# Patient Record
Sex: Male | Born: 1990 | Race: White | Hispanic: No | Marital: Single | Smoking: Never smoker
Health system: Southern US, Community
[De-identification: ages and names within clinical notes are randomized; demographics above are authoritative.]

---
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
## 2013-11-03 ENCOUNTER — Emergency Department: Payer: Self-pay | Admitting: Emergency Medicine

## 2016-09-29 ENCOUNTER — Encounter: Payer: Self-pay | Admitting: Urgent Care

## 2016-09-29 DIAGNOSIS — K7010 Alcoholic hepatitis without ascites: Secondary | ICD-10-CM | POA: Insufficient documentation

## 2016-09-29 DIAGNOSIS — R1011 Right upper quadrant pain: Secondary | ICD-10-CM | POA: Diagnosis present

## 2016-09-29 LAB — CBC
HCT: 45.7 % (ref 40.0–52.0)
Hemoglobin: 15.6 g/dL (ref 13.0–18.0)
MCH: 33 pg (ref 26.0–34.0)
MCHC: 34.1 g/dL (ref 32.0–36.0)
MCV: 97 fL (ref 80.0–100.0)
PLATELETS: 117 10*3/uL — AB (ref 150–440)
RBC: 4.72 MIL/uL (ref 4.40–5.90)
RDW: 13.4 % (ref 11.5–14.5)
WBC: 6.3 10*3/uL (ref 3.8–10.6)

## 2016-09-29 NOTE — ED Triage Notes (Signed)
Patient presents with c/o palpitations that began earlier tonight while at work. Patient reports that he became nauseated and experienced a couple of episodes of hematemesis. Patient went onto [employer], which reportedly "freaked him out", prompting him to come in. Patient reports that he used alcohol last night; denies illegal drug use.

## 2016-09-30 ENCOUNTER — Emergency Department: Payer: PRIVATE HEALTH INSURANCE

## 2016-09-30 ENCOUNTER — Emergency Department
Admission: EM | Admit: 2016-09-30 | Discharge: 2016-09-30 | Disposition: A | Discharge status: Home / Self Care | Payer: PRIVATE HEALTH INSURANCE | Attending: Emergency Medicine | Admitting: Emergency Medicine

## 2016-09-30 DIAGNOSIS — K7010 Alcoholic hepatitis without ascites: Secondary | ICD-10-CM

## 2016-09-30 DIAGNOSIS — R1011 Right upper quadrant pain: Secondary | ICD-10-CM

## 2016-09-30 LAB — COMPREHENSIVE METABOLIC PANEL
ALK PHOS: 79 U/L (ref 38–126)
ALT: 144 U/L — AB (ref 17–63)
AST: 171 U/L — AB (ref 15–41)
Albumin: 4.9 g/dL (ref 3.5–5.0)
Anion gap: 15 (ref 5–15)
BILIRUBIN TOTAL: 1.3 mg/dL — AB (ref 0.3–1.2)
BUN: 8 mg/dL (ref 6–20)
CALCIUM: 9.5 mg/dL (ref 8.9–10.3)
CHLORIDE: 96 mmol/L — AB (ref 101–111)
CO2: 28 mmol/L (ref 22–32)
CREATININE: 0.85 mg/dL (ref 0.61–1.24)
Glucose, Bld: 152 mg/dL — ABNORMAL HIGH (ref 65–99)
Potassium: 3.7 mmol/L (ref 3.5–5.1)
Sodium: 139 mmol/L (ref 135–145)
TOTAL PROTEIN: 8.6 g/dL — AB (ref 6.5–8.1)

## 2016-09-30 LAB — TROPONIN I: Troponin I: <0.03 ng/mL (ref <0.03)

## 2016-09-30 LAB — LIPASE, BLOOD: LIPASE: 20 U/L (ref 11–51)

## 2016-09-30 MED ORDER — IBUPROFEN 600 MG PO TABS
600.0000 mg | ORAL_TABLET | Freq: Once | ORAL | Status: AC
  Administered 2016-09-30: 600 mg via ORAL
  Filled 2016-09-30: qty 1

## 2016-09-30 MED ORDER — ONDANSETRON HCL 4 MG/2ML IJ SOLN
4.0000 mg | Freq: Once | INTRAMUSCULAR | Status: AC
  Administered 2016-09-30: 4 mg via INTRAVENOUS
  Filled 2016-09-30: qty 2

## 2016-09-30 MED ORDER — TRAMADOL HCL 50 MG PO TABS: 50.0000 mg | ORAL_TABLET | Freq: Four times a day (QID) | ORAL | 0 refills | Status: AC | PRN

## 2016-09-30 MED ORDER — ONDANSETRON HCL 4 MG/2ML IJ SOLN
INTRAMUSCULAR | Status: AC
  Administered 2016-09-30: 4 mg via INTRAVENOUS
  Filled 2016-09-30: qty 2

## 2016-09-30 MED ORDER — ONDANSETRON 4 MG PO TBDP: 4.0000 mg | ORAL_TABLET | Freq: Three times a day (TID) | ORAL | 0 refills | Status: DC | PRN

## 2016-09-30 MED ORDER — SODIUM CHLORIDE 0.9 % IV BOLUS (SEPSIS)
1000.0000 mL | Freq: Once | INTRAVENOUS | Status: AC
  Administered 2016-09-30: 1000 mL via INTRAVENOUS

## 2016-09-30 MED ORDER — ONDANSETRON HCL 4 MG/2ML IJ SOLN
4.0000 mg | Freq: Once | INTRAMUSCULAR | Status: AC
  Administered 2016-09-30: 4 mg via INTRAVENOUS

## 2016-09-30 NOTE — ED Notes (Signed)
Pt returned from [place]. States feels dehydrated and has HA. Asking for tylenol.

## 2016-09-30 NOTE — ED Notes (Signed)
Pt given multiple water cups to drink. Has not thrown up since starting PO fluids.

## 2016-09-30 NOTE — ED Notes (Signed)
Dr. Brown at bedside

## 2016-09-30 NOTE — ED Provider Notes (Signed)
[facility]    First MD Initiated Contact with Patient 09/30/16 [phone]     (approximate)  I have reviewed the triage vital signs and the nursing notes.   HISTORY  Chief Complaint Palpitations and Hematemesis    HPI Connor Davis is a 25 y.o. male presents with nausea vomiting and epigastric/right upper quadrant abdominal pain 1 day. Patient states that he has been drinking heavily for the past week approximately a fifth per day in addition to beer. Starting today the patient admits to 8 out of 10 epigastric/right upper quadrant abdominal pain as well as headache. Patient denies any fever and no diarrhea or constipation.   Past medical history None There are no active problems to display for this patient.   Past surgical history None  Prior to Admission medications   Medication Sig Start Date End Date Taking? Authorizing Provider  ondansetron (ZOFRAN ODT) 4 MG disintegrating tablet Take 1 tablet (4 mg total) by mouth every 8 (eight) hours as needed for nausea or vomiting. 09/30/16   Connor Current, MD  traMADol (ULTRAM) 50 MG tablet Take 1 tablet (50 mg total) by mouth every 6 (six) hours as needed. 09/30/16 09/30/17  Connor Current, MD    Allergies Shrimp [shellfish allergy]  No family history on file.  Social History Social History  Substance Use Topics  . Smoking status: Never Smoker  . Smokeless tobacco: Never Used  . Alcohol use Yes    Review of Systems Constitutional: No fever/chills Eyes: No visual changes. ENT: No sore throat. Cardiovascular: Denies chest pain. Respiratory: Denies shortness of breath. Gastrointestinal: Positive for abdominal pain.  And vomiting  constipation. Genitourinary: Negative for dysuria. Musculoskeletal: Negative for back pain. Skin: Negative for rash. Neurological: Negative for headaches, focal weakness or numbness.  10-point ROS otherwise  negative.  ____________________________________________   PHYSICAL EXAM:  VITAL SIGNS: ED Triage Vitals  Enc Vitals Group     BP 09/29/16 2329 (!) 167/100     Pulse Rate 09/29/16 2329 (!) 110     Resp 09/29/16 2329 20     Temp 09/29/16 2329 97.7 F (36.5 C)     Temp Source 09/29/16 2329 Oral     SpO2 09/29/16 2329 100 %     Weight 09/29/16 2331 225 lb (102.1 kg)     Height 09/29/16 2331 5\' 11"  (1.803 m)     Head Circumference --      Peak Flow --      Pain Score 09/29/16 2339 2     Pain Loc --      Pain Edu? --      Excl. in GC? --     Constitutional: Alert and oriented. Well appearing and in no acute distress. Eyes: Conjunctivae are normal. PERRL. EOMI. Head: Atraumatic. Mouth/Throat: Mucous membranes are Dry.  Oropharynx non-erythematous. Neck: No stridor.  No meningeal signs.  No cervical spine tenderness to palpation. Cardiovascular: Normal rate, regular rhythm. Good peripheral circulation. Grossly normal heart sounds. Respiratory: Normal respiratory effort.  No retractions. Lungs CTAB. Gastrointestinal: Right upper quadrant/epigastric abdominal pain with palpation. No distention.  Musculoskeletal: No lower extremity tenderness nor edema. No gross deformities of extremities. Neurologic:  Normal speech and language. No gross focal neurologic deficits are appreciated.  Skin:  Skin is warm, dry and intact. No rash noted. Psychiatric: Mood and affect are normal. Speech and behavior are normal.  ____________________________________________   LABS (all labs ordered are listed, but only abnormal results are displayed)  Labs Reviewed  CBC - Abnormal; Notable for the following:       Result Value   Platelets 117 (*)    All other components within normal limits  COMPREHENSIVE METABOLIC PANEL - Abnormal; Notable for the following:    Chloride 96 (*)    Glucose, Bld 152 (*)    Total Protein 8.6 (*)    AST 171 (*)    ALT 144 (*)    Total Bilirubin 1.3 (*)    All other  components within normal limits  TROPONIN I  LIPASE, BLOOD   ____________________________________________  EKG  ED ECG REPORT I, [place] N Hutton Pellicane, the attending physician, personally viewed and interpreted this ECG.   Date: 09/30/2016  EKG Time: 11:37 PM  Rate: 101  Rhythm: Sinus tachycardia  Axis: Normal  Intervals: Normal  ST&T Change: None  ____________________________________________  RADIOLOGY I, [place] N Richetta Cubillos, personally viewed and evaluated these images (plain radiographs) as part of my medical decision making, as well as reviewing the written report by the radiologist.  [place] Abdomen Limited Ruq  Result Date: 09/30/2016 CLINICAL DATA:  25 year old male with right upper quadrant abdominal pain. Nausea and vomiting. EXAM: US ABDOMEN LIMITED - RIGHT UPPER QUADRANT COMPARISON:  None. FINDINGS: Gallbladder: No gallstones or wall thickening visualized. No sonographic Murphy sign noted by sonographer. Common bile duct: Diameter: 3 mm Liver: No focal lesion identified. Within normal limits in parenchymal echogenicity. IMPRESSION: Unremarkable right upper quadrant ultrasound. Electronically Signed   By: Elgie CollardArash  Radparvar M.D.   On: 09/30/2016 04:27      Procedures    INITIAL IMPRESSION / ASSESSMENT AND PLAN / ED COURSE  Pertinent labs & imaging results that were available during my care of the patient were reviewed by me and considered in my medical decision making (see chart for details).  Patient given IV Zofran and 2 L IV normal saline with no further vomiting noting any emergency department. History physical exam and laboratory data concerning for possible alcohol-induced hepatitis. Spoke with patient at length regarding the need to decrease alcohol intake. Patient will be prescribed Zofran for home with recommendation to follow-up with primary care provider for repeat liver enzymes  Clinical Course    ____________________________________________  FINAL CLINICAL  IMPRESSION(S) / ED DIAGNOSES  Final diagnoses:  RUQ pain  Alcoholic hepatitis without ascites     MEDICATIONS GIVEN DURING THIS VISIT:  Medications  ondansetron (ZOFRAN) injection 4 mg (4 mg Intravenous Given 09/30/16 0220)  sodium chloride 0.9 % bolus 1,000 mL (1,000 mLs Intravenous New Bag/Given 09/30/16 0450)  ondansetron (ZOFRAN) injection 4 mg (4 mg Intravenous Given 09/30/16 0450)  ibuprofen (ADVIL,MOTRIN) tablet 600 mg (600 mg Oral Given 09/30/16 0450)     NEW OUTPATIENT MEDICATIONS STARTED DURING THIS VISIT:  New Prescriptions   ONDANSETRON (ZOFRAN ODT) 4 MG DISINTEGRATING TABLET    Take 1 tablet (4 mg total) by mouth every 8 (eight) hours as needed for nausea or vomiting.   TRAMADOL (ULTRAM) 50 MG TABLET    Take 1 tablet (50 mg total) by mouth every 6 (six) hours as needed.    Modified Medications   No medications on file    Discontinued Medications   No medications on file     Note:  This document was prepared using Dragon voice recognition software and may include unintentional dictation errors.    Connor Currentandolph N Kaz Auld, MD 09/30/16 [phone]

## 2016-10-06 ENCOUNTER — Encounter: Payer: Self-pay | Admitting: Internal Medicine

## 2016-10-06 ENCOUNTER — Ambulatory Visit (INDEPENDENT_AMBULATORY_CARE_PROVIDER_SITE_OTHER): Payer: PRIVATE HEALTH INSURANCE | Admitting: Internal Medicine

## 2016-10-06 VITALS — BP 132/86 | HR 73 | Temp 97.7°F | Ht 69.5 in | Wt 200.0 lb

## 2016-10-06 DIAGNOSIS — K7010 Alcoholic hepatitis without ascites: Secondary | ICD-10-CM | POA: Diagnosis not present

## 2016-10-06 DIAGNOSIS — R748 Abnormal levels of other serum enzymes: Secondary | ICD-10-CM

## 2016-10-06 LAB — COMPREHENSIVE METABOLIC PANEL
ALT: 122 U/L — AB (ref 0–53)
AST: 186 U/L — AB (ref 0–37)
Albumin: 4.4 g/dL (ref 3.5–5.2)
Alkaline Phosphatase: 74 U/L (ref 39–117)
BUN: 14 mg/dL (ref 6–23)
CALCIUM: 9.7 mg/dL (ref 8.4–10.5)
CO2: 28 mEq/L (ref 19–32)
Chloride: 104 mEq/L (ref 96–112)
Creatinine, Ser: 0.89 mg/dL (ref 0.40–1.50)
GFR: 110.26 mL/min (ref >60.00)
Glucose, Bld: 91 mg/dL (ref 70–99)
Potassium: 4.4 mEq/L (ref 3.5–5.1)
Sodium: 138 mEq/L (ref 135–145)
TOTAL PROTEIN: 7.6 g/dL (ref 6.0–8.3)
Total Bilirubin: 0.8 mg/dL (ref 0.2–1.2)

## 2016-10-06 NOTE — Patient Instructions (Signed)
Alcoholic Hepatitis °Alcoholic hepatitis is liver inflammation caused by drinking alcohol. This inflammation decreases the liver's ability to function normally.  °CAUSES  °Alcoholic hepatitis is caused by heavy drinking.  °RISK FACTORS °You may have an increased risk of alcoholic hepatitis if:  °· You drink large amounts of alcohol. °· You have been drinking heavily for years. °· You binge drink. °· You are male. °· You are obese. °· You have had infectious hepatitis. °· You are malnourished. °· You have close family members who have had alcoholic hepatitis. °SIGNS AND SYMPTOMS °· Abdominal pain. °· A swollen abdomen. °· Loss of appetite. °· Unintentional weight loss. °· Nausea and vomiting. °· Tiredness. °· Dry mouth. °· Severe thirst. °· A yellow tone to the skin and whites of the eyes (jaundice). °· Spidery veins, especially across the skin of the abdomen. °· Unusual bleeding. °· Itching. °· Trouble thinking clearly. °· Memory problems. °· Mood changes. °· Confusion. °· Numbness and tingling in the feet and legs. °DIAGNOSIS  °Alcoholic hepatitis is diagnosed with blood tests that show problems with liver function. Additional tests may also be done, such as: °· An ultrasound. °· A CT scan. °· An MRI scan. °· A liver biopsy. For this test, a small sample of the liver will be taken and examined for evidence of liver damage. °TREATMENT °The most important step you can take to treat alcoholic hepatitis is to stop drinking alcohol. If you are addicted to alcohol, your health care provider will help you create a plan to quit. It may involve: °· Taking medicine to decrease withdrawal symptoms. °· Entering a program to help you stop drinking. °· Joining a support group. °Additional treatment for alcoholic hepatitis may include: °· Medicines such as steroids. The medicines will help decrease the inflammation. °· Diet. Your health care provider might ask you to undergo nutritional counseling and follow a diet. You may  also need to take dietary supplements and vitamins. °· A liver transplant. This procedure is performed in very severe cases. It is only performed on people who have totally stopped drinking and can commit to never drinking alcohol again. °HOME CARE INSTRUCTIONS °· Do not drink alcohol. °· Do not use medicines or eat foods that contain alcohol. °· Take medicines only as directed by your health care provider. °· Follow dietary instructions carefully. °· Keep all follow-up visits as directed by your health care provider. This is important. °SEEK MEDICAL CARE IF: °· You have a fever. °· You do not have your usual appetite. °· You have flu-like symptoms such as fatigue, weakness, or muscle aches. °· You feel nauseous or vomit. °· You bruise easily. °· Your urine is very dark. °· You have new abdominal pain. °SEEK IMMEDIATE MEDICAL CARE IF: °· There is blood in your vomit. °· You develop jaundice. °· Your skin itches severely. °· Your legs swell. °· Your stomach appears bloated. °· You have black, tarry-appearing stools. °· You bleed easily. °· You are confused or not thinking clearly. °· You have a seizure. °MAKE SURE YOU: °· Understand these instructions. °· Will watch your condition. °· Will get help right away if you are not doing well or get worse. °  °This information is not intended to replace advice given to you by your health care provider. Make sure you discuss any questions you have with your health care provider. °  °Document Released: 06/18/2014 Document Reviewed: 06/18/2014 °Elsevier Interactive Patient Education ©2016 [employer] Inc. ° °

## 2016-10-06 NOTE — Progress Notes (Signed)
HPI  Pt presents to the clinic today to establish care. He has not had a PCP in many years.  He reports he was recently in the ER 09/30/16, after having some RUQ abdominal pain, nausea and vomiting. Liver enzymes were elevated. Ultrasound of the RUQ was normal. He was diagnosed with alcoholic hepatitis and advised to stop drinking and follow up with his PCP. He reports he has not had any alcohol since that time. He does work in [employer]. He denies IV drug use or sexual contact with someone how has known Hep C.  Flu: never Tetanus: 2014, he thinks Dentist: as needed  History reviewed. No pertinent past medical history.  Current Outpatient Prescriptions  Medication Sig Dispense Refill  . ibuprofen (ADVIL,MOTRIN) 200 MG tablet Take 200 mg by mouth 3 times/day as needed-between meals & bedtime.    . ondansetron (ZOFRAN ODT) 4 MG disintegrating tablet Take 1 tablet (4 mg total) by mouth every 8 (eight) hours as needed for nausea or vomiting. 20 tablet 0  . traMADol (ULTRAM) 50 MG tablet Take 1 tablet (50 mg total) by mouth every 6 (six) hours as needed. 20 tablet 0   No current facility-administered medications for this visit.     Allergies  Allergen Reactions  . Amoxicillin Hives  . Penicillins Hives  . Shrimp [Shellfish Allergy]     Family History  Problem Relation Age of Onset  . Hypertension Mother   . Hypertension Father   . Hypertension Brother   . Diabetes Maternal Aunt   . Hypertension Paternal Aunt   . Diabetes Maternal Grandmother     Social History   Social History  . Marital status: Single    Spouse name: N/A  . Number of children: N/A  . Years of education: N/A   Occupational History  . Not on file.   Social History Main Topics  . Smoking status: Never Smoker  . Smokeless tobacco: Never Used  . Alcohol use Yes     Comment: beer/liquor--moderate to daily  . Drug use: No  . Sexual activity: Not on file   Other Topics Concern  . Not on file    Social History Narrative  . No narrative on file    ROS:  Constitutional: Denies fever, malaise, fatigue, headache or abrupt weight changes.  Respiratory: Denies difficulty breathing, shortness of breath, cough or sputum production.   Cardiovascular: Denies chest pain, chest tightness, palpitations or swelling in the hands or feet.  Gastrointestinal: Denies abdominal pain, bloating, constipation, diarrhea or blood in the stool.  Skin: Denies redness, rashes, lesions or ulcercations.  Neurological: Denies dizziness, difficulty with memory, difficulty with speech or problems with balance and coordination.  Psych: Denies anxiety, depression, SI/HI.  No other specific complaints in a complete review of systems (except as listed in HPI above).  PE:  BP 132/86   Pulse 73   Temp 97.7 F (36.5 C) (Oral)   Ht 5' 9.5" (1.765 m)   Wt 200 lb (90.7 kg)   SpO2 98%   BMI 29.11 kg/m  Wt Readings from Last 3 Encounters:  10/06/16 200 lb (90.7 kg)  09/29/16 225 lb (102.1 kg)    General: Appears his stated age, well developed, well nourished in NAD. Cardiovascular: Normal rate and rhythm. S1,S2 noted.  No murmur, rubs or gallops noted. Pulmonary/Chest: Normal effort and positive vesicular breath sounds. No respiratory distress. No wheezes, rales or ronchi noted.  Abdomen: Soft and nontender. Normal bowel sounds. No distention or  masses noted. Liver, spleen and kidneys non palpable. Neurological: Alert and oriented.  Psychiatric: Mood and affect normal. Behavior is normal. Judgment and thought content normal.     BMET    Component Value Date/Time   NA 139 09/29/2016 2343   K 3.7 09/29/2016 2343   CL 96 (L) 09/29/2016 2343   CO2 28 09/29/2016 2343   GLUCOSE 152 (H) 09/29/2016 2343   BUN 8 09/29/2016 2343   CREATININE 0.85 09/29/2016 2343   CALCIUM 9.5 09/29/2016 2343   GFRNONAA >60 09/29/2016 2343   GFRAA >60 09/29/2016 2343    Lipid Panel  No results found for: CHOL, TRIG,  HDL, CHOLHDL, VLDL, LDLCALC  CBC    Component Value Date/Time   WBC 6.3 09/29/2016 2343   RBC 4.72 09/29/2016 2343   HGB 15.6 09/29/2016 2343   HCT 45.7 09/29/2016 2343   PLT 117 (L) 09/29/2016 2343   MCV 97.0 09/29/2016 2343   MCH 33.0 09/29/2016 2343   MCHC 34.1 09/29/2016 2343   RDW 13.4 09/29/2016 2343    Hgb A1C No results found for: HGBA1C   Assessment and Plan:  ER follow up for elevated liver enzymes:  ER notes, labs and imaging reviewed Encouraged him to stay abstinent from ETOH CMET and acute hep panel today  Will follow up after labs, RTC in 1 year for your annual exam Nicki ReaperBAITY, REGINA, NP

## 2016-10-07 LAB — HEPATITIS PANEL, ACUTE
HCV AB: NEGATIVE
HEP B C IGM: NONREACTIVE
Hep A IgM: NONREACTIVE
Hepatitis B Surface Ag: NEGATIVE

## 2016-11-02 ENCOUNTER — Other Ambulatory Visit: Payer: Self-pay | Admitting: Internal Medicine

## 2016-11-02 DIAGNOSIS — R748 Abnormal levels of other serum enzymes: Secondary | ICD-10-CM

## 2016-11-08 ENCOUNTER — Other Ambulatory Visit (INDEPENDENT_AMBULATORY_CARE_PROVIDER_SITE_OTHER): Payer: PRIVATE HEALTH INSURANCE

## 2016-11-08 DIAGNOSIS — R748 Abnormal levels of other serum enzymes: Secondary | ICD-10-CM | POA: Diagnosis not present

## 2016-11-08 LAB — COMPREHENSIVE METABOLIC PANEL
ALT: 19 U/L (ref 0–53)
AST: 23 U/L (ref 0–37)
Albumin: 4.2 g/dL (ref 3.5–5.2)
Alkaline Phosphatase: 77 U/L (ref 39–117)
BILIRUBIN TOTAL: 0.5 mg/dL (ref 0.2–1.2)
BUN: 16 mg/dL (ref 6–23)
CALCIUM: 9.4 mg/dL (ref 8.4–10.5)
CHLORIDE: 105 meq/L (ref 96–112)
CO2: 29 meq/L (ref 19–32)
Creatinine, Ser: 0.96 mg/dL (ref 0.40–1.50)
GFR: 100.96 mL/min (ref >60.00)
Glucose, Bld: 91 mg/dL (ref 70–99)
POTASSIUM: 4.4 meq/L (ref 3.5–5.1)
Sodium: 141 mEq/L (ref 135–145)
Total Protein: 7.2 g/dL (ref 6.0–8.3)

## 2017-02-13 IMAGING — US US ABDOMEN LIMITED
1 series · 14 of 25 positions shown · non-contrast
Comparison: None.

CLINICAL DATA: 25-year-old male with right upper quadrant abdominal
pain. Nausea and vomiting.

EXAM:
US ABDOMEN LIMITED - RIGHT UPPER QUADRANT

[Series 1: us abdomen limited · 0.22mm/px · 40 acquisitions, 14 frames shown]
[im 1/40]
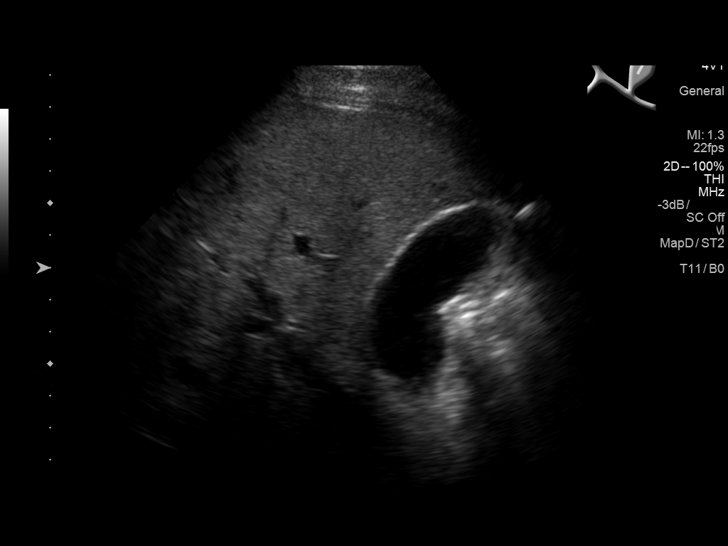
[im 4/40]
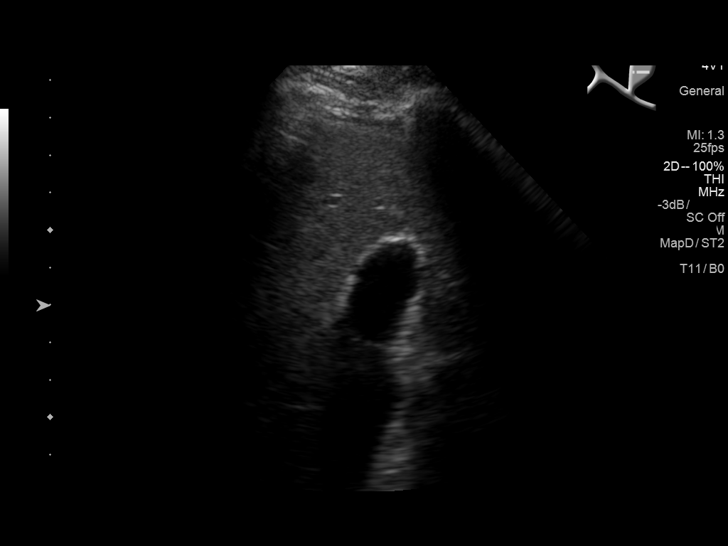
[im 7/40]
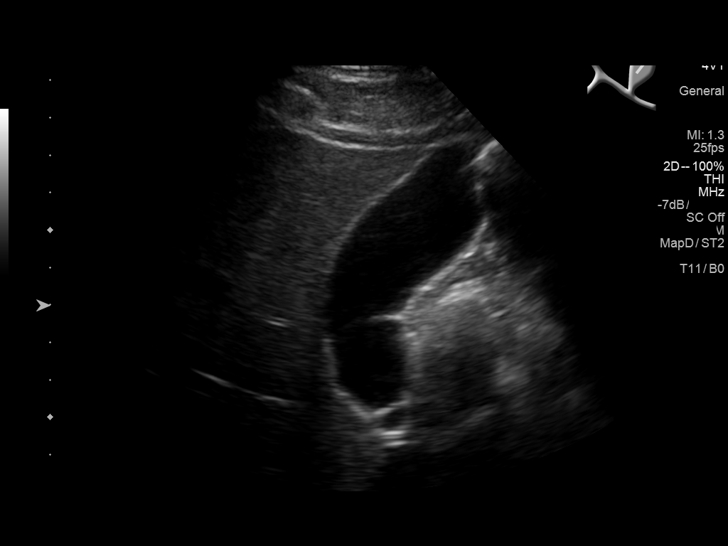
[im 10/40]
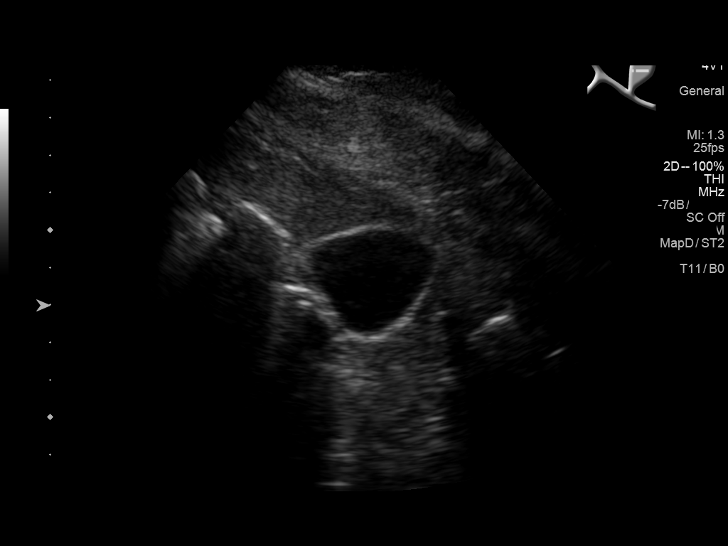
[im 14/40]
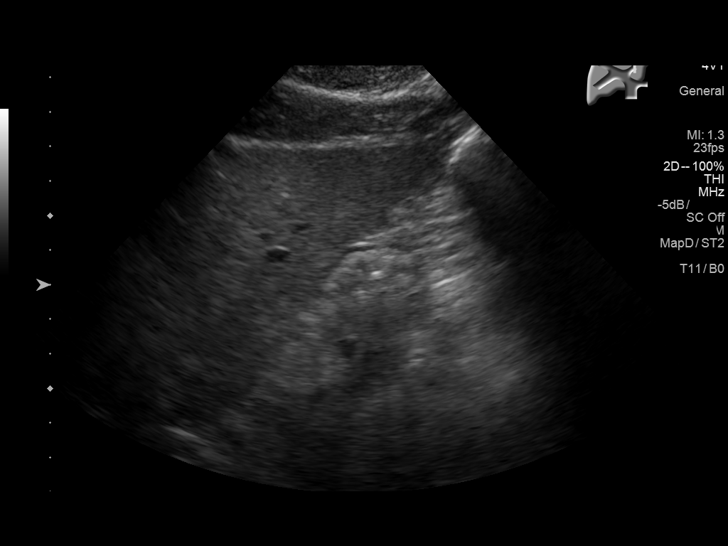
[im 15/40]
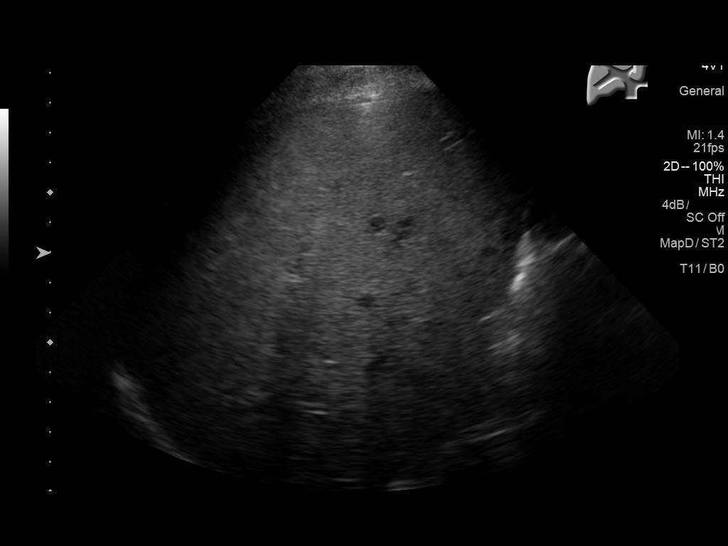
[im 18/40]
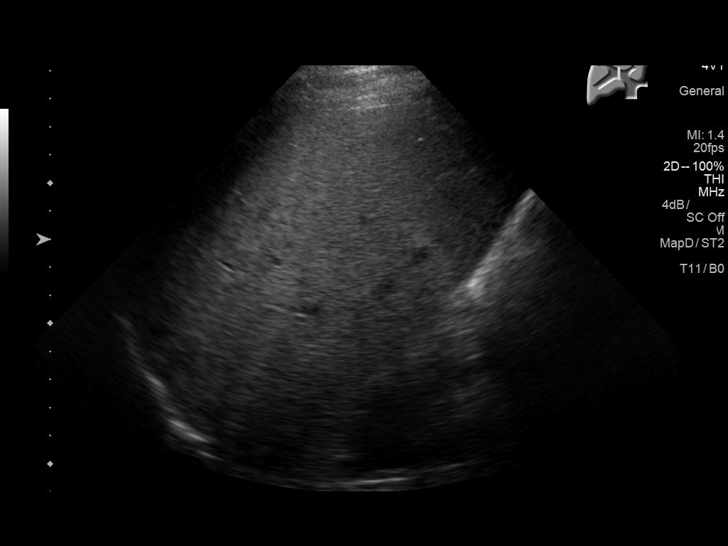
[im 22/40]
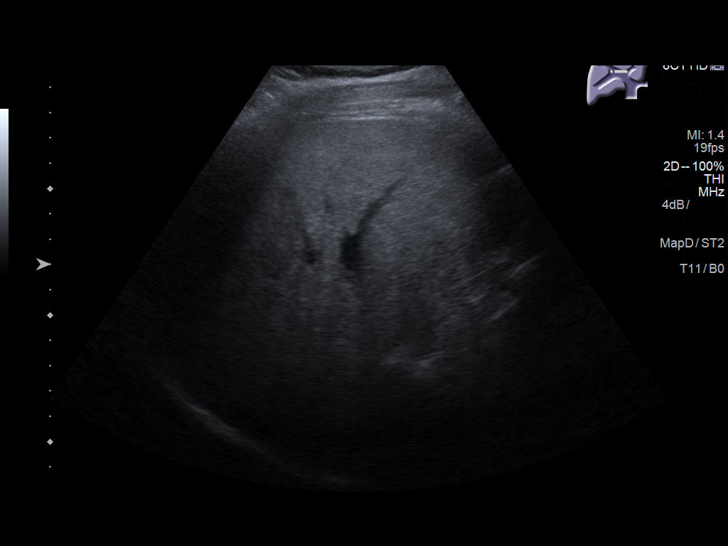
[im 25/40]
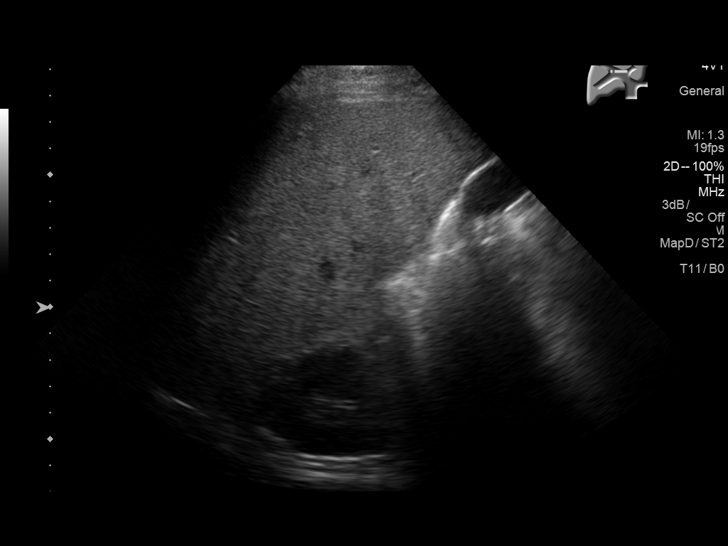
[im 27/40]
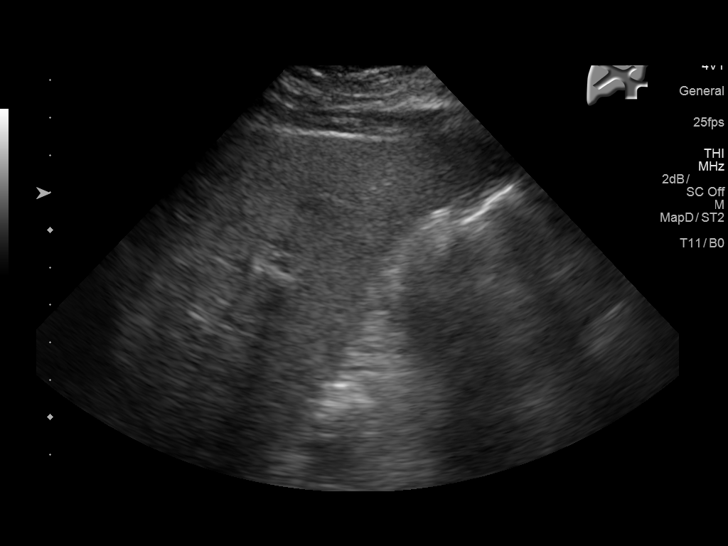
[im 30/40]
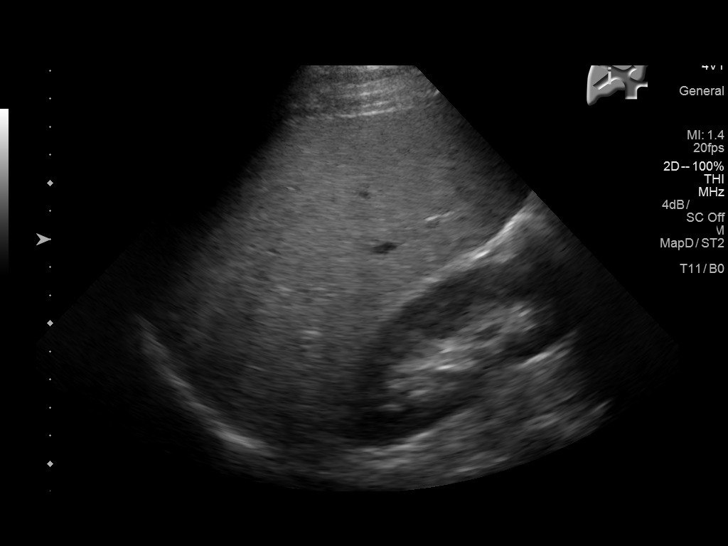
[im 33/40]
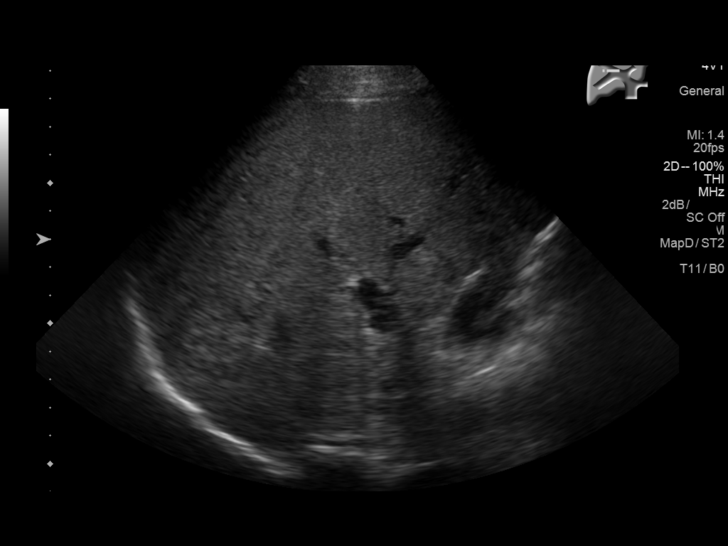
[im 36/40]
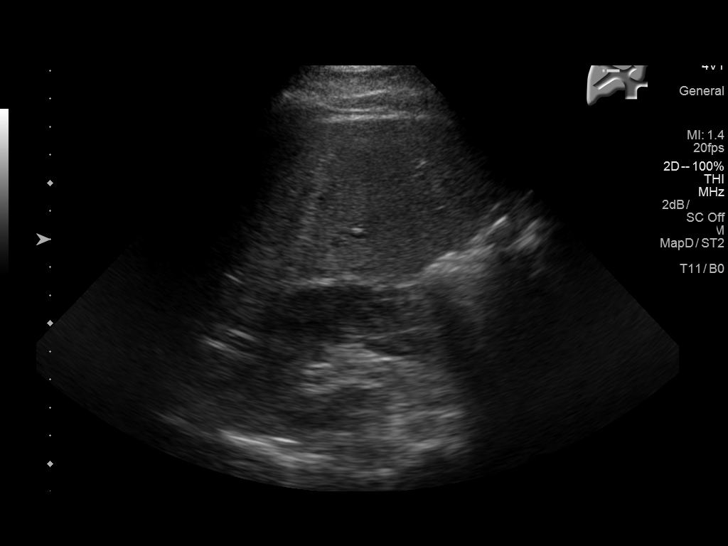
[im 40/40]
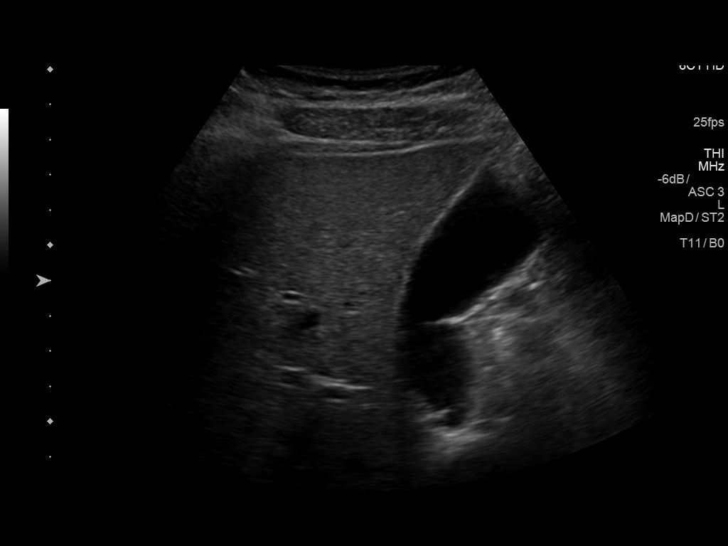

[14 of 25 positions shown; findings below may reference images not displayed]

FINDINGS: Gallbladder:

No gallstones or wall thickening visualized. No sonographic Murphy
sign noted by sonographer.

Common bile duct:

Diameter: 3 mm

Liver:

No focal lesion identified. Within normal limits in parenchymal
echogenicity.
IMPRESSION: Unremarkable right upper quadrant ultrasound.

## 2017-10-03 ENCOUNTER — Ambulatory Visit: Payer: PRIVATE HEALTH INSURANCE | Admitting: Internal Medicine

## 2017-10-03 DIAGNOSIS — Z0289 Encounter for other administrative examinations: Secondary | ICD-10-CM

## 2017-10-04 ENCOUNTER — Encounter: Payer: Self-pay | Admitting: Family Medicine

## 2017-10-04 ENCOUNTER — Ambulatory Visit (INDEPENDENT_AMBULATORY_CARE_PROVIDER_SITE_OTHER): Payer: PRIVATE HEALTH INSURANCE | Admitting: Family Medicine

## 2017-10-04 VITALS — BP 110/78 | HR 67 | Temp 98.5°F | Ht 69.5 in | Wt 196.8 lb

## 2017-10-04 DIAGNOSIS — Z7721 Contact with and (suspected) exposure to potentially hazardous body fluids: Secondary | ICD-10-CM

## 2017-10-04 DIAGNOSIS — N489 Disorder of penis, unspecified: Secondary | ICD-10-CM | POA: Diagnosis not present

## 2017-10-04 MED ORDER — VALACYCLOVIR HCL 1 G PO TABS: 1000.0000 mg | ORAL_TABLET | Freq: Two times a day (BID) | ORAL | 0 refills | Status: AC

## 2017-10-04 NOTE — Progress Notes (Signed)
Dr. Karleen HampshireSpencer T. Maher Shon, MD, CAQ [facility] [address] [place], [id number] Phone: [phone] Fax: [phone]  10/04/2017  Patient: Connor KalataBrandon D Davis, MRN: [id number], DOB: 04/15/1991, 26 y.o.  Primary Physician:  Lorre MunroeBaity, Regina W, NP   Chief Complaint  Patient presents with  . Cuts around base of penis   Subjective:   Connor Davis is a 26 y.o. very pleasant male patient who presents with the following:  Hsv? Pleasant young man he has had some painful ulcerations in cuts at both the base of his penis and at the base of the glans which is gotten worse after intercourse. His girlfriend of 5 years does have known HSV positivity. No known other STD exposures.  Past Medical History, Surgical History, Social History, Family History, Problem List, Medications, and Allergies have been reviewed and updated if relevant.  There are no active problems to display for this patient.   No past medical history on file.  No past surgical history on file.  Social History   Social History  . Marital status: Single    Spouse name: N/A  . Number of children: N/A  . Years of education: N/A   Occupational History  . Not on file.   Social History Main Topics  . Smoking status: Never Smoker  . Smokeless tobacco: Never Used  . Alcohol use No     Comment: beer/liquor--moderate to daily  . Drug use: No  . Sexual activity: Yes   Other Topics Concern  . Not on file   Social History Narrative  . No narrative on file    Family History  Problem Relation Age of Onset  . Hypertension Mother   . Hypertension Father   . Hypertension Brother   . Diabetes Maternal Aunt   . Hypertension Paternal Aunt   . Diabetes Maternal Grandmother     Allergies  Allergen Reactions  . Amoxicillin Hives  . Penicillins Hives  . Shrimp [Shellfish Allergy]     Medication list reviewed and updated in full in [facility].   GEN: No acute  illnesses, no fevers, chills. GI: No n/v/d, eating normally Pulm: No SOB Interactive and getting along well at home.  Otherwise, ROS is as per the HPI.  Objective:   BP 110/78   Pulse 67   Temp 98.5 F (36.9 C) (Oral)   Ht 5' 9.5" (1.765 m)   Wt 196 lb 12 oz (89.2 kg)   BMI 28.64 kg/m   GEN: WDWN, NAD, Non-toxic, A & O x 3 HEENT: Atraumatic, Normocephalic. Neck supple. No masses, No LAD. Ears and Nose: No external deformity. CV: RRR, No M/G/R. No JVD. No thrill. No extra heart sounds. PULM: CTA B, no wheezes, crackles, rhonchi. No retractions. No resp. distress. No accessory muscle use. EXTR: No c/c/e NEURO Normal gait.  PSYCH: Normally interactive. Conversant. Not depressed or anxious appearing.  Calm demeanor.   3 small ulcerated lesions at the base of the glans, there is one that is actively ulcerated and there is another area at the base of the penis that is primarily healed.  Laboratory and Imaging Data:  Assessment and Plan:   Penile lesion - Plan: C. trachomatis/N. gonorrhoeae RNA, HIV antibody, RPR, Herpes Simplex Virus Culture, HSV 1/2 Ab (IgM), IFA w/rflx Titer, HSV(herpes simplex vrs) 1+2 ab-IgG, CANCELED: HSV Type I/II IgG, IgMw/ reflex  Exposure to blood or body fluid - Plan: C. trachomatis/N. gonorrhoeae RNA, HIV antibody, RPR, Herpes  Simplex Virus Culture, HSV 1/2 Ab (IgM), IFA w/rflx Titer, HSV(herpes simplex vrs) 1+2 ab-IgG, CANCELED: HSV Type I/II IgG, IgMw/ reflex  Suspicious for acute HSV, and we will treat this as such. I hope that the culture comes back, but I did the best with what lesions were available. We will check a PCR additionally. Generalized STD check also.  Follow-up: No Follow-up on file.  Meds ordered this encounter  Medications  . valACYclovir (VALTREX) 1000 MG tablet    Sig: Take 1 tablet (1,000 mg total) by mouth 2 (two) times daily.    Dispense:  20 tablet    Refill:  0   Medications Discontinued During This Encounter  Medication  Reason  . ondansetron (ZOFRAN ODT) 4 MG disintegrating tablet Completed Course   Orders Placed This Encounter  Procedures  . C. trachomatis/N. gonorrhoeae RNA  . Herpes Simplex Virus Culture  . HIV antibody  . RPR  . HSV 1/2 Ab (IgM), IFA w/rflx Titer  . HSV(herpes simplex vrs) 1+2 ab-IgG    Signed,  Briunna Leicht T. Yasmeen Manka, MD   Allergies as of 10/04/2017      Reactions   Amoxicillin Hives   Penicillins Hives   Shrimp [shellfish Allergy]       Medication List       Accurate as of 10/04/17  1:32 PM. Always use your most recent med list.          ibuprofen 200 MG tablet Commonly known as:  ADVIL,MOTRIN Take 200 mg by mouth 3 times/day as needed-between meals & bedtime.   valACYclovir 1000 MG tablet Commonly known as:  VALTREX Take 1 tablet (1,000 mg total) by mouth 2 (two) times daily.

## 2017-10-05 LAB — HIV ANTIBODY (ROUTINE TESTING W REFLEX): HIV 1&2 Ab, 4th Generation: NONREACTIVE

## 2017-10-05 LAB — RPR: RPR: NONREACTIVE

## 2017-10-05 NOTE — Addendum Note (Signed)
Addended by: Alvina ChouWALSH, Brayli Klingbeil J on: 10/05/2017 11:33 AM   Modules accepted: Orders

## 2017-10-06 LAB — HSV 1/2 AB (IGM), IFA W/RFLX TITER
HSV 1 IGM SCREEN: NEGATIVE
HSV 2 IgM Screen: NEGATIVE

## 2017-10-06 LAB — HERPES SIMPLEX VIRUS CULTURE
MICRO NUMBER: 81221834
SPECIMEN QUALITY: ADEQUATE

## 2017-10-06 LAB — C. TRACHOMATIS/N. GONORRHOEAE RNA
C. trachomatis RNA, TMA: NOT DETECTED
N. gonorrhoeae RNA, TMA: NOT DETECTED

## 2017-10-06 LAB — HSV(HERPES SIMPLEX VRS) I + II AB-IGG: HSV 2 IGG,TYPE SPECIFIC AB: <0.9 index
